# Patient Record
Sex: Male | Born: 1985 | Race: White | Hispanic: No | Marital: Single | Smoking: Never smoker
Health system: Southern US, Community
[De-identification: ages and names within clinical notes are randomized; demographics above are authoritative.]

---

## 2015-04-12 ENCOUNTER — Other Ambulatory Visit: Payer: Self-pay

## 2015-04-12 ENCOUNTER — Encounter: Payer: Self-pay | Admitting: Urgent Care

## 2015-04-12 DIAGNOSIS — T796XXA Traumatic ischemia of muscle, initial encounter: Secondary | ICD-10-CM | POA: Diagnosis not present

## 2015-04-12 DIAGNOSIS — Y9289 Other specified places as the place of occurrence of the external cause: Secondary | ICD-10-CM | POA: Insufficient documentation

## 2015-04-12 DIAGNOSIS — T754XXA Electrocution, initial encounter: Secondary | ICD-10-CM | POA: Diagnosis present

## 2015-04-12 DIAGNOSIS — Y998 Other external cause status: Secondary | ICD-10-CM | POA: Insufficient documentation

## 2015-04-12 DIAGNOSIS — Y9389 Activity, other specified: Secondary | ICD-10-CM | POA: Diagnosis not present

## 2015-04-12 DIAGNOSIS — W85XXXA Exposure to electric transmission lines, initial encounter: Secondary | ICD-10-CM | POA: Insufficient documentation

## 2015-04-12 LAB — BASIC METABOLIC PANEL
Anion gap: 5 (ref 5–15)
BUN: 26 mg/dL — AB (ref 6–20)
CHLORIDE: 107 mmol/L (ref 101–111)
CO2: 28 mmol/L (ref 22–32)
CREATININE: 1.14 mg/dL (ref 0.61–1.24)
Calcium: 9.2 mg/dL (ref 8.9–10.3)
GFR calc Af Amer: >60 mL/min (ref >60)
GFR calc non Af Amer: >60 mL/min (ref >60)
GLUCOSE: 97 mg/dL (ref 65–99)
POTASSIUM: 3.7 mmol/L (ref 3.5–5.1)
Sodium: 140 mmol/L (ref 135–145)

## 2015-04-12 LAB — CK: Total CK: 482 U/L — ABNORMAL HIGH (ref 49–397)

## 2015-04-12 LAB — CBC
HEMATOCRIT: 48.4 % (ref 40.0–52.0)
Hemoglobin: 16.5 g/dL (ref 13.0–18.0)
MCH: 29.5 pg (ref 26.0–34.0)
MCHC: 34.1 g/dL (ref 32.0–36.0)
MCV: 86.6 fL (ref 80.0–100.0)
Platelets: 196 10*3/uL (ref 150–440)
RBC: 5.59 MIL/uL (ref 4.40–5.90)
RDW: 14.1 % (ref 11.5–14.5)
WBC: 8.9 10*3/uL (ref 3.8–10.6)

## 2015-04-12 LAB — TROPONIN I: Troponin I: <0.03 ng/mL (ref <0.031)
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
## 2015-04-13 ENCOUNTER — Emergency Department: Payer: Worker's Compensation

## 2015-04-13 ENCOUNTER — Emergency Department
Admission: EM | Admit: 2015-04-13 | Discharge: 2015-04-13 | Disposition: A | Discharge status: Home / Self Care | Payer: Worker's Compensation | Attending: Emergency Medicine | Admitting: Emergency Medicine

## 2015-04-13 DIAGNOSIS — T796XXA Traumatic ischemia of muscle, initial encounter: Secondary | ICD-10-CM

## 2015-04-13 DIAGNOSIS — T754XXA Electrocution, initial encounter: Secondary | ICD-10-CM

## 2015-04-13 LAB — URINALYSIS COMPLETE WITH MICROSCOPIC (ARMC ONLY)
BACTERIA UA: NONE SEEN
BILIRUBIN URINE: NEGATIVE
Glucose, UA: NEGATIVE mg/dL
Hgb urine dipstick: NEGATIVE
KETONES UR: NEGATIVE mg/dL
LEUKOCYTES UA: NEGATIVE
Nitrite: NEGATIVE
PROTEIN: NEGATIVE mg/dL
SPECIFIC GRAVITY, URINE: 1.03 (ref 1.005–1.030)
pH: 5 (ref 5.0–8.0)

## 2015-04-13 LAB — TROPONIN I: Troponin I: <0.03 ng/mL (ref <0.031)

## 2015-04-13 LAB — CK: Total CK: 358 U/L (ref 49–397)

## 2015-04-13 MED ORDER — SODIUM CHLORIDE 0.9 % IV BOLUS (SEPSIS)
1000.0000 mL | Freq: Once | INTRAVENOUS | Status: AC
  Administered 2015-04-13: 1000 mL via INTRAVENOUS
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
## 2015-04-13 NOTE — ED Notes (Signed)
MD at bedside for reeval

## 2015-04-13 NOTE — Discharge Instructions (Signed)
Electric Shock Injury °Electric shock injuries may be caused by lightning or electricity (current) passing through the body. The amount of injury depends on the current's pressure (voltage), the amount of current (amperage), the type of current (direct vs. alternating), the body's resistance to the current, the current's path through the body, and how long the body remains in contact with the current. Current is the flow of electricity. Electricity may produce effects ranging from barely noticeable tingling to instant death; every part of the body is vulnerable.  °The harshness of injury depends mostly on the voltage. Low voltage can be as dangerous as high voltage under the right circumstances. People have been killed by shocks of just 50 volts. °WHAT DETERMINES THE EFFECTS OF ELECTRICITY? °How electric shocks affect the skin is determined by the skin's resistance. This is the skin's ability to stay unharmed by a shock. This, in turn, depends upon the wetness, dryness, thickness and or cleanliness of the skin. Thin or wet skin is much less resistant than thick or dry skin. When skin resistance is low, the current may cause little or no skin damage but may severely burn internal organs and tissues. Conversely, high skin resistance, such as with dry thick skin, can produce severe skin burns but decreases the current entering the body. °WHAT PARTS OF THE BODY DOES ELECTRICITY AFFECT THE MOST? °· The nervous system (the brain, spinal cord, and nerves) are most helpless to the effects of electricity and most often harmed in electrical injury. Some damage is minor and clears up on its own or with treatment. Sometimes the damage is severe and will be permanent. Neurological problems may be apparent immediately after the accident, or gradually develop over a period of up to three years. °· Damage to the respiratory and cardiovascular systems happens immediately. Electric shocks can paralyze the respiratory system (stop  breathing) or disrupt heart action (cause the heart to beat irregularly or stop). This may cause instant death. Smaller veins and arteries, which get hot more easily than the larger blood vessels, are at greater risk. They can develop blood clots. Damage to the smaller vessels is a common cause of amputation following high-voltage injuries. °· Other injuries may include cataracts, kidney failure, and injury to muscle tissue. An electric arc may set clothing and flammable substances on fire which may cause burns. Strong shocks are often accompanied by violent muscle spasms that can break and dislocate bones. These spasms can also freeze the victim in place and prevent him or her from breaking away from the current. °DIAGNOSIS  °Diagnosis relies on information about the cause of the accident, physical examination, and close monitoring of the heart, lungs, neurological condition and kidney activity. These conditions can change rapidly so close observation is necessary. Magnetic resonance imaging (MRI) may be necessary to check for brain injury. °TREATMENT  °· When an electrical accident happens at home or in the workplace, emergency medical help should be summoned as quickly as possible. The main power should immediately be shut off. If that cannot be done, and current is still flowing through the victim, stand on a dry, non-conducting surface such as a folded newspaper, flattened cardboard carton, or plastic or rubber mat. Use a non-conducting object such as a wooden broomstick (never a damp or metallic object) to push the victim away from the source of the current. Non-conducting means the substance will not pass electricity easily through it. Do not touch the victim or electrical source while the current is still flowing. This   may electrocute the rescuer. °· If the victim is faint, pale, or showing signs of shock, lay the victim down, with the legs elevated above the level of the chest. Warm the person with a  blanket. °· If a pulse can not be felt, or the person is not breathing, someone trained in cardiopulmonary resuscitation (CPR) should begin CPR. Continue this until help arrives. °· If the victim is burned, remove clothing that comes off easily. Rinse the burned area in cool water for pain relief. Give first aid for burns. Burns often require treatment at a burn center. °· Electrical injury can be associated with explosions or falls that can cause other injuries. Avoid moving the head or neck if an injury to this area is suspected. °· Give first aid as needed for other wounds or fractures. °· Fluid replacement therapy is necessary to restore lost fluids and electrolytes. Severely injured tissue is repaired surgically. °· Antibiotics and antibacterial creams are used to prevent infection. °· Kidney failure may need to be treated. °· Physical therapy may help recovery along with counseling if there is disfigurement. °PROGNOSIS  °· Electric shocks may cause death. °· Survivors may require amputation. Cosmetic problems may result along with disfigurement. °· Injuries from household appliances and other low-voltage sources are less likely to produce extreme damage. °PREVENTION  °· Know electrical dangers in your home. °· Damaged electric appliances, wiring, cords, and plugs should be repaired or replaced. Electrical repairs should be attempted only by people with the proper training. °· Hair dryers, radios, and other electric appliances should never be used in the bathroom or anywhere else they might accidentally come in contact with water. Water and pipes create a ground and the electricity picks the easiest way to go to ground which can be through your body. °· Young children need to be kept away from electric appliances and should be taught about the dangers of electricity as soon as they are old enough. °· Electric outlets require safety covers in homes with young children. °· During lightning and thunder storms, go  indoors immediately, even if no rain is falling. Boaters should return to shore as rapidly as possible. °· If the hair on your head or arms stands on end during a storm, seek cover as rapidly as possible as a lightning strike may be about to happen. °· If you cannot reach indoor shelter, stay away from metallic objects such as golf clubs or fishing rods and lie down in low-ground areas. Standing or lying under or next to tall or metallic structures is unsafe. For example, it is unsafe to stand under a tree during a lightning storm. Do not stand next to long conductors of electricity such as wire fences. °· An automobile is appropriate cover, as long as the radio is off. °· Telephones, computers, hair dryers, and other appliances that can act as channels for lightning should not be used during a thunder storm. °· During storms, stay away from screens and metal that may conduct electricity from the outside. °SEEK IMMEDIATE MEDICAL CARE IF: °· You develop chest pain. °· A part of your arms or legs becomes very swollen or painful. °· One of your arms or legs appears pale, cool, or discolored. °· Your urine becomes discolored, or you are not urinating as much as usual. °· You develop severe abdominal pain. °Document Released: 08/03/2003 Document Revised: 10/23/2011 Document Reviewed: 10/27/2013 °[employer]® Patient Information ©2015 [employer]. This information is not intended to replace advice given to you by   your health care provider. Make sure you discuss any questions you have with your health care provider. ° °

## 2015-04-13 NOTE — ED Notes (Signed)
MD at bedside for eval.

## 2015-04-13 NOTE — ED Notes (Signed)
X-ray at bedside
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
